# Patient Record
Sex: Male | Born: 1970 | Hispanic: No | Marital: Married | Smoking: Current every day smoker
Health system: Southern US, Community
[De-identification: ages and names within clinical notes are randomized; demographics above are authoritative.]

---

## 2006-03-28 HISTORY — PX: NASAL FRACTURE SURGERY: SHX718

## 2010-03-28 HISTORY — PX: OTHER SURGICAL HISTORY: SHX169

## 2010-05-10 ENCOUNTER — Emergency Department (HOSPITAL_BASED_OUTPATIENT_CLINIC_OR_DEPARTMENT_OTHER)
Admission: EM | Admit: 2010-05-10 | Discharge: 2010-05-10 | Disposition: A | Discharge status: Home / Self Care | Payer: Worker's Compensation | Attending: Emergency Medicine | Admitting: Emergency Medicine

## 2010-05-10 ENCOUNTER — Emergency Department (INDEPENDENT_AMBULATORY_CARE_PROVIDER_SITE_OTHER): Payer: Worker's Compensation

## 2010-05-10 DIAGNOSIS — S62339A Displaced fracture of neck of unspecified metacarpal bone, initial encounter for closed fracture: Secondary | ICD-10-CM | POA: Insufficient documentation

## 2010-05-10 DIAGNOSIS — Y9289 Other specified places as the place of occurrence of the external cause: Secondary | ICD-10-CM | POA: Insufficient documentation

## 2010-05-10 DIAGNOSIS — W19XXXA Unspecified fall, initial encounter: Secondary | ICD-10-CM | POA: Insufficient documentation

## 2010-05-10 DIAGNOSIS — Y9269 Other specified industrial and construction area as the place of occurrence of the external cause: Secondary | ICD-10-CM

## 2010-05-10 DIAGNOSIS — F17200 Nicotine dependence, unspecified, uncomplicated: Secondary | ICD-10-CM | POA: Insufficient documentation

## 2011-05-16 IMAGING — CR DG HAND COMPLETE 3+V*R*
3 series · 3 of 3 positions shown · non-contrast
Comparison: None.

CLINICAL DATA: Fall with fifth metacarpal pain.

RIGHT HAND - COMPLETE 3+ VIEW

[x hand pa right]
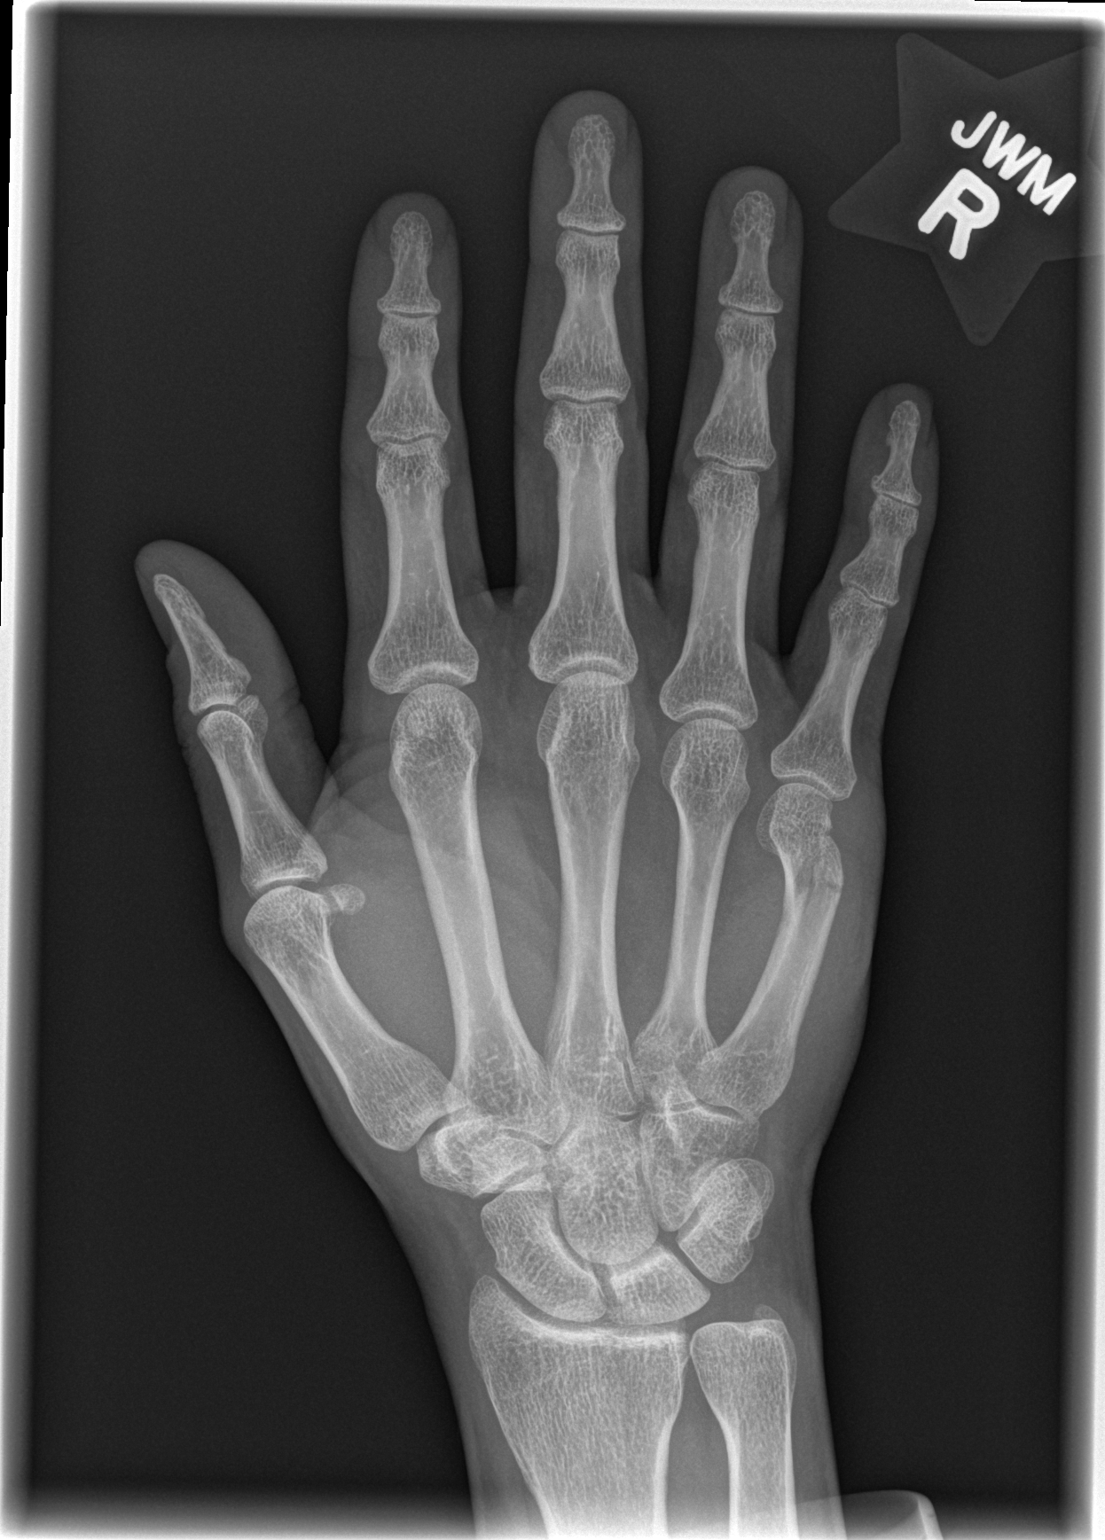

[x hand oblique right]
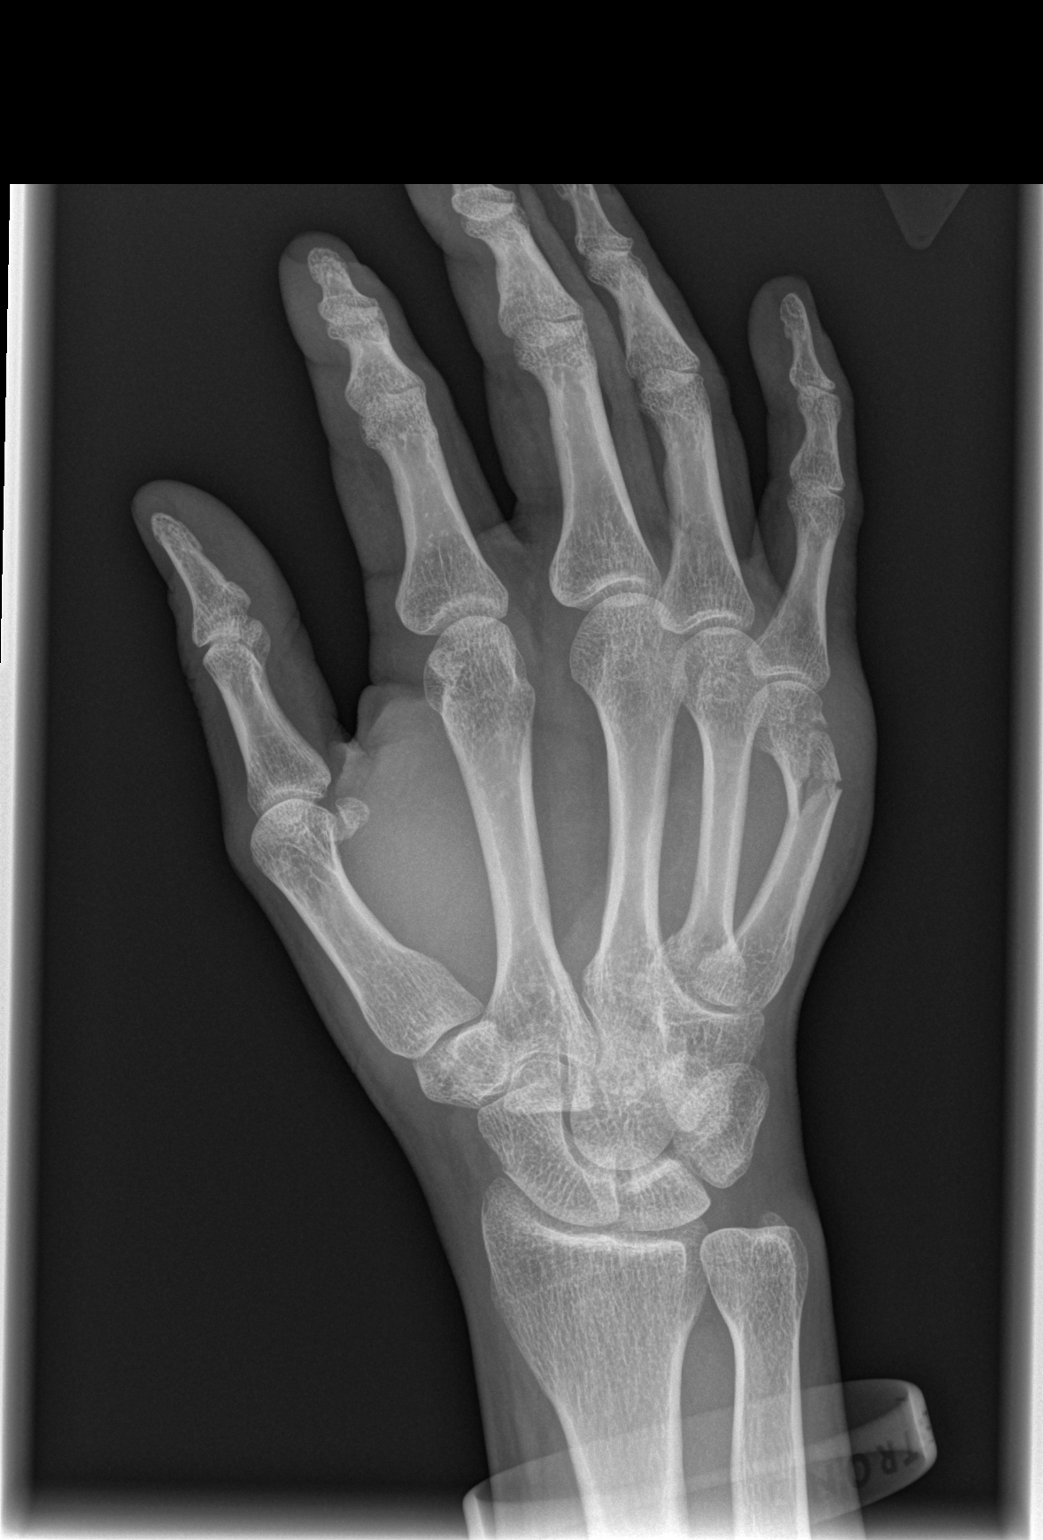

[x hand lat right]
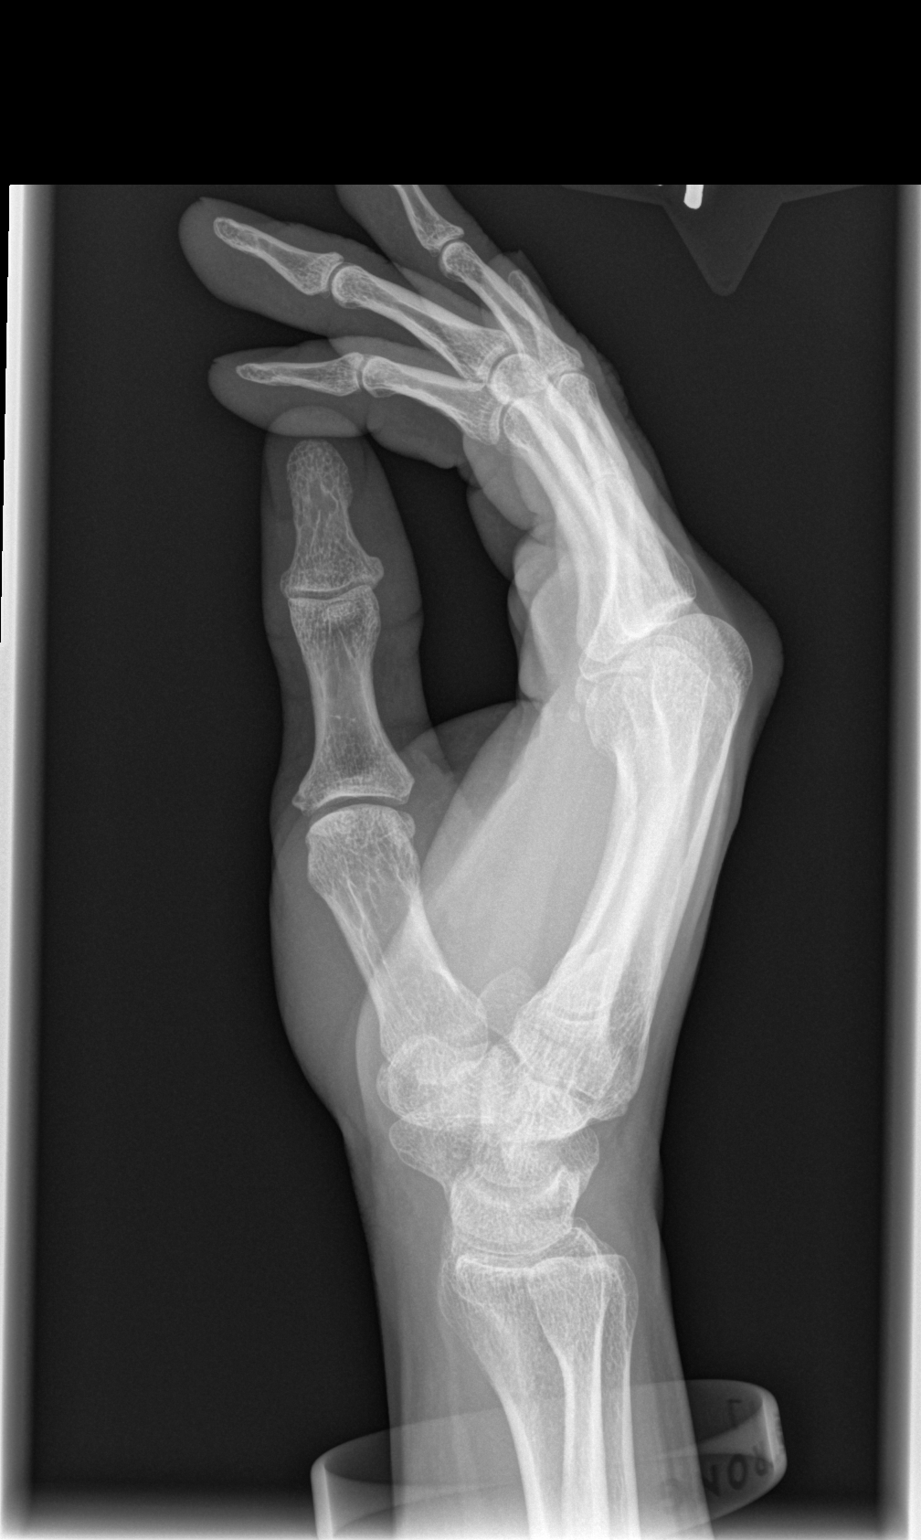

[3 of 3 positions shown; findings below may reference images not displayed]

FINDINGS: Comminuted fracture of the distal fifth metacarpal noted.
There is apex posterior angulation.  No definite evidence for
fracture extension to the articular surface of the distal fifth
metacarpal.  No other acute bony abnormality is evident.
IMPRESSION: Comminuted fracture of the distal fifth metacarpal with apex
posterior angulation.

## 2018-10-24 ENCOUNTER — Other Ambulatory Visit: Payer: Self-pay

## 2018-10-24 DIAGNOSIS — Z20822 Contact with and (suspected) exposure to covid-19: Secondary | ICD-10-CM

## 2018-10-25 LAB — NOVEL CORONAVIRUS, NAA: SARS-CoV-2, NAA: NOT DETECTED

## 2021-04-30 ENCOUNTER — Ambulatory Visit: Payer: Self-pay | Admitting: Internal Medicine

## 2021-05-03 ENCOUNTER — Other Ambulatory Visit: Payer: Self-pay

## 2021-05-03 ENCOUNTER — Encounter: Payer: Self-pay | Admitting: Internal Medicine

## 2021-05-03 ENCOUNTER — Ambulatory Visit: Payer: Self-pay | Admitting: Internal Medicine

## 2021-05-03 VITALS — BP 152/102 | HR 80 | Resp 12 | Ht 68.25 in | Wt 179.0 lb

## 2021-05-03 DIAGNOSIS — Z8342 Family history of familial hypercholesterolemia: Secondary | ICD-10-CM

## 2021-05-03 DIAGNOSIS — E7800 Pure hypercholesterolemia, unspecified: Secondary | ICD-10-CM | POA: Insufficient documentation

## 2021-05-03 DIAGNOSIS — R030 Elevated blood-pressure reading, without diagnosis of hypertension: Secondary | ICD-10-CM

## 2021-05-03 DIAGNOSIS — R1032 Left lower quadrant pain: Secondary | ICD-10-CM

## 2021-05-03 DIAGNOSIS — R7303 Prediabetes: Secondary | ICD-10-CM

## 2021-05-03 DIAGNOSIS — E010 Iodine-deficiency related diffuse (endemic) goiter: Secondary | ICD-10-CM

## 2021-05-03 DIAGNOSIS — Z833 Family history of diabetes mellitus: Secondary | ICD-10-CM

## 2021-05-03 DIAGNOSIS — Z720 Tobacco use: Secondary | ICD-10-CM

## 2021-05-03 HISTORY — DX: Pure hypercholesterolemia, unspecified: E78.00

## 2021-05-03 HISTORY — DX: Prediabetes: R73.03

## 2021-05-03 NOTE — Progress Notes (Signed)
Subjective:    Patient ID: GAVRIEL HOLZHAUER, male   DOB: Jul 21, 1970, 51 y.o.   MRN: [id number]   HPI  Here to establish   Back Pain/LLQ pain:  Has been a problem for about 3 years.  Does not happen often--maybe 3 times yearly and lasts up to 2 weeks, though sometimes only 2 days.  Bilateral low back with discomfort also in left low abdomen.  He thinks the pain wraps around to the front and does not shoot through to the front.  Describes the discomfort in his abdomen as aching or numb.  When he presses on the left low abdominal area when having the pain--increase in pain and describes like a needle in his abdomen.   Has tried ibuprofen and Tylenol, but does not really help.  An ice pack to his low abdomen helps.   Has never had an injury to his back.  He is not aware of anything repetitious prior to back pain starting.   Maybe when eats something spicy, he gets the abdominal pain and then the back pain--clarifies that the LLQ pain is what occurs first.  He also often gets a bitter taste in his mouth when he has the pain and can get nauseated and vomit when has the pain.   He has vomited many times with this pain. Has not noted melena or hematochezia with these abdominal pain episodes.  He occasionally sees bright red blood per rectum.  When he has these, his stools are flat and he has anal itching.  Denies constipation or diarrhea.   Has lost weight since 10/2020 as he cut out sodas and has eaten healthier.  Believes he has lost about 25 lbs.   Appears he has lost actually 11 lbs since 08/2020 Urgent Care visit.   2.  Elevated BP:  Has never been told has high BP.  This is the first time he has seen a doctor in 20 years other than his fracture and urgent care back in 08/2020.  Did not have elevated BP back in 6 /2022.    No outpatient medications have been marked as taking for the 05/03/21 encounter (Office Visit) with Julieanne Manson, MD.   No Known Allergies  Past Surgical History:   Procedure Laterality Date   NASAL FRACTURE SURGERY N/A 2008   ORIF Right 5th Metacarpal fracture Right 2012   Family History  Problem Relation Age of Onset   Hypertension Mother    Hyperlipidemia Father    Kidney disease Father        due to DM/Htn   Hypertension Father    Diabetes Father    Psoriasis Father    Cleft palate Daughter    Cancer Cousin 41       colon and pancreatic cancer (not clear what primary)   Cancer Nephew 55       colon or pancreatic cancer vs other GI cancer    Social History   Socioeconomic History   Marital status: Married    Spouse name: Johnny Bridge   Number of children: 3   Years of education: 16   Highest education level: Bachelor's degree (e.g., BA, AB, BS)  Occupational History   Not on file  Tobacco Use   Smoking status: Every Day    Packs/day: 0.25    Years: 30.00    Pack years: 7.50    Types: Cigarettes   Smokeless tobacco: Never  Vaping Use   Vaping Use: Never used  Substance and Sexual  Activity   Alcohol use: Yes    Comment: Rare   Drug use: Never   Sexual activity: Not on file  Other Topics Concern   Not on file  Social History Narrative   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   College grad in administration.   Has lived in [place] years   Lives with wife and two youngest children and his parents in [place].   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   Social Determinants of Corporate investment banker Strain: Low Risk    Difficulty of Paying Living Expenses: Not hard at all  Food Insecurity: No Food Insecurity   Worried About Programme researcher, broadcasting/film/video in the Last Year: Never true   Barista in the Last Year: Never true  Transportation Needs: No Transportation Needs   Lack of Transportation (Medical): No   Lack of Transportation (Non-Medical): No  Physical Activity: Not on file  Stress: Not on file  Social Connections: Not on file  Intimate Partner Violence: Not At Risk   Fear of Current or  Ex-Partner: No   Emotionally Abused: No   Physically Abused: No   Sexually Abused: No     PMH:  none     Review of Systems    Objective:   BP (!) 152/102 (BP Location: Left Arm, Patient Position: Sitting)    Pulse 80    Resp 12    Ht 5' 8.25" (1.734 m)    Wt 179 lb (81.2 kg)    BMI 27.02 kg/m   Physical Exam NAD HEENT:  PERRL, EOMI, TMs pearly gray, throat without injection Neck:  supple, No adenopathy, + thyomegaly Chest:  CTA CV:  RRR with normal S1 and S2, No S3, S4 or murmur.  No carotid bruits.  Carotid, radial and DP pulses normal and equal.   Abd:  S, NT, No HSM or mass, + BS LE:  No edema.   Assessment & Plan   Intermittent LLQ pain:  no findings today.  History supports likely constipation and internal hemorrhoids. Suspect internal hemorrhoids.  Discussed healthy diet with high fibers and good fluid intake.  CBC, CMP, PSA.  To return FIT in next 2 weeks.  2.  Tobacco abuse:  very infrequent use of cigarettes, but encouraged him to quit for prevention of lung disease, other chronic health issues.  He does not see this as a problem.  3.  Elevated BP:  BP check in 2 weeks.  4.  Family history of hypercholesterolemia and DM:  FLP and A1C today.  5.  Thyromegaly:  TSH  6.  HM:  no interest in any vaccines today:  discussed Tdap, Shingrix, COVID

## 2021-05-03 NOTE — Patient Instructions (Signed)

## 2021-05-04 LAB — COMPREHENSIVE METABOLIC PANEL
ALT: 28 IU/L (ref 0–44)
AST: 24 IU/L (ref 0–40)
Albumin/Globulin Ratio: 2 (ref 1.2–2.2)
Albumin: 4.8 g/dL (ref 3.8–4.9)
Alkaline Phosphatase: 116 IU/L (ref 44–121)
BUN/Creatinine Ratio: 11 (ref 9–20)
BUN: 12 mg/dL (ref 6–24)
Bilirubin Total: 1.1 mg/dL (ref 0.0–1.2)
CO2: 24 mmol/L (ref 20–29)
Calcium: 9.5 mg/dL (ref 8.7–10.2)
Chloride: 103 mmol/L (ref 96–106)
Creatinine, Ser: 1.11 mg/dL (ref 0.76–1.27)
Globulin, Total: 2.4 g/dL (ref 1.5–4.5)
Glucose: 100 mg/dL — ABNORMAL HIGH (ref 70–99)
Potassium: 4.4 mmol/L (ref 3.5–5.2)
Sodium: 141 mmol/L (ref 134–144)
Total Protein: 7.2 g/dL (ref 6.0–8.5)
eGFR: 80 mL/min/{1.73_m2} (ref >59)

## 2021-05-04 LAB — LIPID PANEL W/O CHOL/HDL RATIO
Cholesterol, Total: 214 mg/dL — ABNORMAL HIGH (ref 100–199)
HDL: 47 mg/dL (ref >39)
LDL Chol Calc (NIH): 146 mg/dL — ABNORMAL HIGH (ref 0–99)
Triglycerides: 117 mg/dL (ref 0–149)
VLDL Cholesterol Cal: 21 mg/dL (ref 5–40)

## 2021-05-04 LAB — CBC WITH DIFFERENTIAL/PLATELET
Basophils Absolute: 0.1 10*3/uL (ref 0.0–0.2)
Basos: 1 %
EOS (ABSOLUTE): 0 10*3/uL (ref 0.0–0.4)
Eos: 1 %
Hematocrit: 42.6 % (ref 37.5–51.0)
Hemoglobin: 14.4 g/dL (ref 13.0–17.7)
Immature Grans (Abs): 0 10*3/uL (ref 0.0–0.1)
Immature Granulocytes: 0 %
Lymphocytes Absolute: 1.5 10*3/uL (ref 0.7–3.1)
Lymphs: 26 %
MCH: 31 pg (ref 26.6–33.0)
MCHC: 33.8 g/dL (ref 31.5–35.7)
MCV: 92 fL (ref 79–97)
Monocytes Absolute: 0.3 10*3/uL (ref 0.1–0.9)
Monocytes: 5 %
Neutrophils Absolute: 4 10*3/uL (ref 1.4–7.0)
Neutrophils: 67 %
Platelets: 242 10*3/uL (ref 150–450)
RBC: 4.64 x10E6/uL (ref 4.14–5.80)
RDW: 12.1 % (ref 11.6–15.4)
WBC: 5.9 10*3/uL (ref 3.4–10.8)

## 2021-05-04 LAB — PSA: Prostate Specific Ag, Serum: 1.2 ng/mL (ref 0.0–4.0)

## 2021-05-04 LAB — HGB A1C W/O EAG: Hgb A1c MFr Bld: 5.9 % — ABNORMAL HIGH (ref 4.8–5.6)

## 2021-05-14 LAB — TSH: TSH: 1.51 u[IU]/mL (ref 0.450–4.500)

## 2021-05-17 ENCOUNTER — Encounter: Payer: Self-pay | Admitting: Internal Medicine

## 2021-05-18 ENCOUNTER — Encounter: Payer: Self-pay | Admitting: Internal Medicine

## 2021-05-18 ENCOUNTER — Other Ambulatory Visit: Payer: Self-pay

## 2021-05-18 ENCOUNTER — Other Ambulatory Visit: Payer: Self-pay | Admitting: Internal Medicine

## 2021-05-18 VITALS — BP 146/90 | HR 60 | Ht 68.25 in | Wt 182.0 lb

## 2021-05-18 DIAGNOSIS — K4090 Unilateral inguinal hernia, without obstruction or gangrene, not specified as recurrent: Secondary | ICD-10-CM

## 2021-05-18 DIAGNOSIS — M67441 Ganglion, right hand: Secondary | ICD-10-CM

## 2021-05-18 DIAGNOSIS — Z0130 Encounter for examination of blood pressure without abnormal findings: Secondary | ICD-10-CM

## 2021-05-18 DIAGNOSIS — I10 Essential (primary) hypertension: Secondary | ICD-10-CM

## 2021-05-18 MED ORDER — LISINOPRIL 10 MG PO TABS: 10.0000 mg | ORAL_TABLET | Freq: Every day | ORAL | 11 refills | Status: DC

## 2021-05-18 NOTE — Patient Instructions (Signed)
Bring FIT test to your bp check in 2 weeks

## 2021-05-18 NOTE — Progress Notes (Signed)
° ° °  Subjective:    Patient ID: Kurt Schultz, male   DOB: 1970-10-24, 51 y.o.   MRN: [id number]   HPI  Here for BP check, but converted to visit as other complaints.   Elevated BP:  continues to run elevated on recheck  2.  Right groin pain:  noted a few days after was here at beginning of month.  When stands, notes a swelling and pain.  Pain radiates around to posterior right pelvis.  No problems passing flatus or BM.  May have some mild nausea with the discomfort.    3.  Swelling over ulnar dorsal DIP joint of middle finger.  When does not do a lot, the swelling goes down, but if   No outpatient medications have been marked as taking for the 05/18/21 encounter (Lab) with Julieanne Manson, MD.   No Known Allergies   Review of Systems    Objective:   BP (!) 146/90 (BP Location: Left Arm, Patient Position: Sitting, Cuff Size: Normal)    Pulse 60    Ht 5' 8.25" (1.734 m)    Wt 182 lb (82.6 kg)    BMI 27.47 kg/m   Physical Exam  NAD Right groin:  Hernia palpated in right groin, not into scrotum.   Reduces completely when supine on exam table.  Unable to palpate the hernia opening. Does have some soft tissue fullness in left groin, but no definite hernia noted.    Right middle finger, ulnar aspect overlying dorsal DIP--cystic feeling lesion--appears to have 2 separate, but perhaps communicating cysts that extend to base of nail.  No erythema or warmth.  NT.  Full ROM of digit.   Assessment & Plan    Hypertension:  Encouraged lifestyle changes.  Start Lisinopril 10 mg daily.  BP check with BMP in 2 weeks.  Bring FIT in with visit as forgot today.  2.  Right inguinal hernia:  Applying for orange card and will then send to [facility].  Discussed what to look for to go to ED for incarcerated or strangulated hernia.  No lifting above 10 lbs letter for work.  3.   Cystic lesion overlying DIP of right middle finger.  Not clear if associated with joint or just overlying.   Referral to ortho.  Will need to apply for [facility].

## 2021-06-07 ENCOUNTER — Other Ambulatory Visit: Payer: Self-pay

## 2021-06-07 VITALS — BP 148/88 | HR 64

## 2021-06-07 DIAGNOSIS — Z0130 Encounter for examination of blood pressure without abnormal findings: Secondary | ICD-10-CM

## 2021-06-07 DIAGNOSIS — Z79899 Other long term (current) drug therapy: Secondary | ICD-10-CM

## 2021-06-07 DIAGNOSIS — Z1211 Encounter for screening for malignant neoplasm of colon: Secondary | ICD-10-CM

## 2021-06-07 LAB — POC FIT TEST STOOL: Fecal Occult Blood: NEGATIVE

## 2021-06-07 MED ORDER — LISINOPRIL 20 MG PO TABS: 10.0000 mg | ORAL_TABLET | Freq: Every day | ORAL | 11 refills | Status: DC

## 2021-06-07 NOTE — Progress Notes (Signed)
Patient reported that he has taken medication consistently without missing days. Patient did not take medication this morning because he usually takes it around 10 am. No questions or concerns regarding medication.  ?

## 2021-06-07 NOTE — Progress Notes (Signed)
Increase to 20 mg of Lisinopril daily--double up on 10 mg tabs once daily and then switch to 1 tab daily when fills 20 mg tabs.   ?BP and pulse check in 1 month ?

## 2021-06-07 NOTE — Addendum Note (Signed)
Addended by: Marcene Duos on: 06/07/2021 04:59 PM ? ? Modules accepted: Orders ? ?

## 2021-06-07 NOTE — Addendum Note (Signed)
Addended by: Mariah Milling on: 06/07/2021 03:34 PM ? ? Modules accepted: Orders ? ?

## 2021-06-08 LAB — BASIC METABOLIC PANEL
BUN/Creatinine Ratio: 13 (ref 9–20)
BUN: 14 mg/dL (ref 6–24)
CO2: 23 mmol/L (ref 20–29)
Calcium: 9.4 mg/dL (ref 8.7–10.2)
Chloride: 99 mmol/L (ref 96–106)
Creatinine, Ser: 1.11 mg/dL (ref 0.76–1.27)
Glucose: 119 mg/dL — ABNORMAL HIGH (ref 70–99)
Potassium: 4.2 mmol/L (ref 3.5–5.2)
Sodium: 144 mmol/L (ref 134–144)
eGFR: 80 mL/min/{1.73_m2} (ref >59)

## 2021-06-10 ENCOUNTER — Telehealth: Payer: Self-pay

## 2021-06-10 NOTE — Telephone Encounter (Signed)
Patient wanted to inform Dr Delrae Alfred that he did not get approved for surgery to help with hernia as previous discussed. Wants to know what the next course of action is. ?

## 2021-06-21 ENCOUNTER — Ambulatory Visit: Payer: Worker's Compensation | Admitting: Orthopedic Surgery

## 2021-07-12 ENCOUNTER — Ambulatory Visit: Payer: Self-pay

## 2021-07-12 VITALS — BP 150/96 | HR 68

## 2021-07-12 DIAGNOSIS — Z0130 Encounter for examination of blood pressure without abnormal findings: Secondary | ICD-10-CM

## 2021-07-12 MED ORDER — HYDROCHLOROTHIAZIDE 25 MG PO TABS: 25.0000 mg | ORAL_TABLET | Freq: Every day | ORAL | 0 refills | Status: AC

## 2021-07-12 NOTE — Progress Notes (Signed)
Patient reported that he is taking bp medication consistently, 20mg  tab daily. Patient took bp medication this morning.  ? ?After reporting bp to Dr Amil Amen, patient will start hydrochlorothiazide 25mg  daily with lisinopril and will recheck bp/BMP in 2 weeks  ?

## 2021-07-19 ENCOUNTER — Ambulatory Visit: Payer: Worker's Compensation | Admitting: Orthopedic Surgery

## 2021-08-02 ENCOUNTER — Other Ambulatory Visit: Payer: Self-pay

## 2021-08-13 ENCOUNTER — Encounter: Payer: Self-pay | Admitting: Internal Medicine

## 2021-08-16 ENCOUNTER — Ambulatory Visit: Payer: Worker's Compensation | Admitting: Orthopedic Surgery

## 2021-09-10 ENCOUNTER — Ambulatory Visit (INDEPENDENT_AMBULATORY_CARE_PROVIDER_SITE_OTHER): Payer: Self-pay | Admitting: Internal Medicine

## 2021-09-10 DIAGNOSIS — Z23 Encounter for immunization: Secondary | ICD-10-CM

## 2021-10-15 ENCOUNTER — Encounter: Payer: Self-pay | Admitting: Internal Medicine

## 2021-10-22 ENCOUNTER — Encounter: Payer: Self-pay | Admitting: Internal Medicine

## 2021-12-16 ENCOUNTER — Encounter: Payer: Self-pay | Admitting: Internal Medicine

## 2022-01-14 ENCOUNTER — Ambulatory Visit: Payer: Self-pay | Admitting: Internal Medicine

## 2022-01-28 ENCOUNTER — Ambulatory Visit: Payer: Self-pay | Admitting: Internal Medicine

## 2022-02-03 ENCOUNTER — Ambulatory Visit: Payer: Self-pay | Admitting: Internal Medicine

## 2022-04-01 ENCOUNTER — Ambulatory Visit (INDEPENDENT_AMBULATORY_CARE_PROVIDER_SITE_OTHER): Payer: Self-pay | Admitting: Internal Medicine

## 2022-04-01 DIAGNOSIS — Z23 Encounter for immunization: Secondary | ICD-10-CM

## 2022-04-05 ENCOUNTER — Encounter: Payer: Self-pay | Admitting: Internal Medicine

## 2022-06-28 ENCOUNTER — Encounter: Payer: Self-pay | Admitting: Internal Medicine

## 2022-08-08 ENCOUNTER — Encounter: Payer: Self-pay | Admitting: Internal Medicine

## 2022-09-22 ENCOUNTER — Telehealth: Payer: Self-pay

## 2022-09-22 NOTE — Telephone Encounter (Signed)
Called patient to offered on appointment for 09/26/2022 . Because patient was scheduled for a Friday appointment. But patient stated he could not make  it to that appt. Patient has been rescheduled 3 times.  Will call patient when there is a cancellation sooner than his scheduled appointment on 01/10/2023

## 2022-10-07 ENCOUNTER — Encounter: Payer: Self-pay | Admitting: Internal Medicine

## 2022-10-14 NOTE — Telephone Encounter (Signed)
Offer patient a sooner appointment. Patient did did not accept appointment.

## 2023-01-02 NOTE — Telephone Encounter (Signed)
Patient is currently out of town and would like to be on wait list for November.

## 2023-01-10 ENCOUNTER — Encounter: Payer: Self-pay | Admitting: Internal Medicine

## 2023-04-13 NOTE — Telephone Encounter (Signed)
Called patient on 04/11/2023. To offer an appointment, patient did not take. Patient decided to schedule his CPE appointment and just wait for that date.

## 2023-09-08 ENCOUNTER — Telehealth: Payer: Self-pay | Admitting: Internal Medicine

## 2023-09-08 NOTE — Telephone Encounter (Signed)
 Patient would like to be on wait list for an appointment for stomach problems. Patient states he is not able to explain to me what is happening with his stomach , but patient is concern that it may be related to his liver.  Patient will be working out of town and will be in town in the beginning of July.

## 2023-09-12 ENCOUNTER — Encounter: Payer: Self-pay | Admitting: Internal Medicine

## 2023-09-15 NOTE — Telephone Encounter (Signed)
 Called patient to offer appointment , patient did not take , patient out of town will be back after 09/30/23

## 2023-10-03 NOTE — Telephone Encounter (Signed)
 Called patient to offer appointment, patient did not take.

## 2023-10-10 NOTE — Telephone Encounter (Signed)
 Patient has been seen.

## 2023-10-12 ENCOUNTER — Encounter: Payer: Self-pay | Admitting: Internal Medicine

## 2023-10-12 ENCOUNTER — Ambulatory Visit: Payer: Self-pay | Admitting: Internal Medicine

## 2023-10-12 VITALS — BP 140/90 | HR 68 | Resp 18 | Ht 68.0 in | Wt 182.0 lb

## 2023-10-12 DIAGNOSIS — R7303 Prediabetes: Secondary | ICD-10-CM

## 2023-10-12 DIAGNOSIS — R109 Unspecified abdominal pain: Secondary | ICD-10-CM

## 2023-10-12 DIAGNOSIS — R1032 Left lower quadrant pain: Secondary | ICD-10-CM

## 2023-10-12 DIAGNOSIS — I10 Essential (primary) hypertension: Secondary | ICD-10-CM

## 2023-10-12 MED ORDER — LISINOPRIL 5 MG PO TABS: 5.0000 mg | ORAL_TABLET | Freq: Every day | ORAL | 11 refills | Status: AC

## 2023-10-12 MED ORDER — METAMUCIL 3 IN 1 DAILY FIBER 400 MG PO CAPS: ORAL_CAPSULE | ORAL | Status: AC

## 2023-10-12 MED ORDER — DICYCLOMINE HCL 20 MG PO TABS: ORAL_TABLET | ORAL | 4 refills | Status: AC

## 2023-10-12 NOTE — Progress Notes (Signed)
 Subjective:    Patient ID: Kurt Schultz, male   DOB: [birth year], 53 y.o.   MRN: [id number]   HPI  Has not been seen since 04/2021   Hypertension:  stopped taking bp meds, lisinopril  and hydrochlorothiazide  2 months ago.  Cannot say why.  Discussed importance of staying on meds to control BP and complications if does not.    2.  Abdominal discomfort:  Problem for more than 1 year.  Occurs at least weekly.  Generally lasts 2-3 days and then disappears.    Can last the entire week. Points to left mid to lower abdominal/flank area.  Feels like a pull  as a muscle is being pulled apart, and it is more intense, radiates around in both directions to his back.   He has associated nausea, but does not believe he has ever vomited.  + loss or appetite also with pain. Can happen at any time--has even awakened him from sleep.  Also states if he lies on left side, the pain will come on or worse if already apparent.   If passes flatus or stool, he does feel better for a while.   Can have constipation and diarrhea with this at times within same day.   No definite melena or hematochezia.   Feels dizzy and with headache at times with the pain. No weight loss.   Not clear if he has had a previous episode.  Just seems to be getting worse over the last year. May be worse with stress.   No family history of definitive digestive diagnoses.       Current Meds  Medication Sig   lansoprazole (PREVACID) 15 MG capsule Take 15 mg by mouth daily at 12 noon.   Multiple Vitamins-Minerals (IMMUNE SYSTEM BOOSTER PO) Take 1 tablet by mouth daily.   No Known Allergies   Review of Systems    Objective:   BP (!) 140/90 (BP Location: Right Arm, Patient Position: Sitting, Cuff Size: Normal)   Pulse 68   Resp 18   Ht 5' 8 (1.727 m)   Wt 182 lb (82.6 kg)   BMI 27.67 kg/m   Physical Exam HENT:     Head: Normocephalic and atraumatic.     Mouth/Throat:     Mouth: Mucous membranes are moist.      Pharynx: Oropharynx is clear.  Eyes:     Extraocular Movements: Extraocular movements intact.     Pupils: Pupils are equal, round, and reactive to light.  Neck:     Thyroid: No thyroid mass or thyromegaly.  Cardiovascular:     Rate and Rhythm: Normal rate and regular rhythm.     Heart sounds: S1 normal and S2 normal. No murmur heard.    No friction rub. No S3 or S4 sounds.     Comments: No carotid bruits.  Carotid, radial,  DP pulses normal and equal.   Pulmonary:     Breath sounds: Normal breath sounds and air entry.  Abdominal:     General: Bowel sounds are normal.     Palpations: Abdomen is soft. There is no hepatomegaly or splenomegaly.     Tenderness: There is abdominal tenderness in the left lower quadrant. There is no left CVA tenderness or rebound (No peritoneal signs).     Hernia: No hernia is present.  Musculoskeletal:     Cervical back: Normal range of motion and neck supple.     Right lower leg: No edema.     Left lower  leg: No edema.  Neurological:     Mental Status: He is alert.      Assessment & Plan   LLQ tenderness and abdominal pain:  No weight loss since last seen 2 years ago.  FIT to return in next 2 weeks.  CBC, CMP.  Start fiber laxative and dicyclomine  to see if improvement for possible IBS.  Refer to GI for diagnostic colonoscopy.  CT of abdomen and pelvis.  He will need to apply for [facility] orange card and let us  know when he receives to send.  Chose not to go through [facility] and pay up front at lower cost.  2.  Hypertension:  start Lisinopril  5 mg daily.  BMP in 2 weeks with BP and pulse check.  With me in 2 months.  3.  Prediabetes:  A1C   4.  HM:  FLP with bp check and BMP in 2 weeks.

## 2023-10-13 LAB — COMPREHENSIVE METABOLIC PANEL WITH GFR
ALT: 21 IU/L (ref 0–44)
AST: 24 IU/L (ref 0–40)
Albumin: 4.7 g/dL (ref 3.8–4.9)
Alkaline Phosphatase: 116 IU/L (ref 44–121)
BUN/Creatinine Ratio: 10 (ref 9–20)
BUN: 11 mg/dL (ref 6–24)
Bilirubin Total: 1.1 mg/dL (ref 0.0–1.2)
CO2: 21 mmol/L (ref 20–29)
Calcium: 9.6 mg/dL (ref 8.7–10.2)
Chloride: 106 mmol/L (ref 96–106)
Creatinine, Ser: 1.13 mg/dL (ref 0.76–1.27)
Globulin, Total: 2.6 g/dL (ref 1.5–4.5)
Glucose: 79 mg/dL (ref 70–99)
Potassium: 4.3 mmol/L (ref 3.5–5.2)
Sodium: 143 mmol/L (ref 134–144)
Total Protein: 7.3 g/dL (ref 6.0–8.5)
eGFR: 77 mL/min/1.73 (ref >59)

## 2023-10-13 LAB — CBC WITH DIFFERENTIAL/PLATELET
Basophils Absolute: 0.1 x10E3/uL (ref 0.0–0.2)
Basos: 1 %
EOS (ABSOLUTE): 0.1 x10E3/uL (ref 0.0–0.4)
Eos: 1 %
Hematocrit: 39.6 % (ref 37.5–51.0)
Hemoglobin: 13.3 g/dL (ref 13.0–17.7)
Immature Grans (Abs): 0 x10E3/uL (ref 0.0–0.1)
Immature Granulocytes: 0 %
Lymphocytes Absolute: 2 x10E3/uL (ref 0.7–3.1)
Lymphs: 27 %
MCH: 32 pg (ref 26.6–33.0)
MCHC: 33.6 g/dL (ref 31.5–35.7)
MCV: 95 fL (ref 79–97)
Monocytes Absolute: 0.4 x10E3/uL (ref 0.1–0.9)
Monocytes: 6 %
Neutrophils Absolute: 4.7 x10E3/uL (ref 1.4–7.0)
Neutrophils: 65 %
Platelets: 240 x10E3/uL (ref 150–450)
RBC: 4.16 x10E6/uL (ref 4.14–5.80)
RDW: 12.5 % (ref 11.6–15.4)
WBC: 7.2 x10E3/uL (ref 3.4–10.8)

## 2023-10-13 LAB — HGB A1C W/O EAG: Hgb A1c MFr Bld: 5.8 % — ABNORMAL HIGH (ref 4.8–5.6)

## 2023-10-27 ENCOUNTER — Other Ambulatory Visit: Payer: Self-pay

## 2023-11-03 ENCOUNTER — Other Ambulatory Visit: Payer: Self-pay

## 2023-11-03 DIAGNOSIS — E7800 Pure hypercholesterolemia, unspecified: Secondary | ICD-10-CM

## 2023-11-03 DIAGNOSIS — Z79899 Other long term (current) drug therapy: Secondary | ICD-10-CM

## 2023-11-03 DIAGNOSIS — Z1211 Encounter for screening for malignant neoplasm of colon: Secondary | ICD-10-CM

## 2023-11-03 LAB — POC FIT TEST STOOL: Fecal Occult Blood: NEGATIVE

## 2023-11-04 LAB — SPECIMEN STATUS

## 2023-11-08 LAB — BASIC METABOLIC PANEL WITH GFR
BUN/Creatinine Ratio: 9 (ref 9–20)
BUN: 10 mg/dL (ref 6–24)
CO2: 22 mmol/L (ref 20–29)
Calcium: 9.5 mg/dL (ref 8.7–10.2)
Chloride: 100 mmol/L (ref 96–106)
Creatinine, Ser: 1.07 mg/dL (ref 0.76–1.27)
Glucose: 103 mg/dL — ABNORMAL HIGH (ref 70–99)
Potassium: 4.4 mmol/L (ref 3.5–5.2)
Sodium: 138 mmol/L (ref 134–144)
eGFR: 82 mL/min/1.73 (ref >59)

## 2023-11-08 LAB — LIPID PANEL W/O CHOL/HDL RATIO
Cholesterol, Total: 189 mg/dL (ref 100–199)
HDL: 42 mg/dL (ref >39)
LDL Chol Calc (NIH): 116 mg/dL — ABNORMAL HIGH (ref 0–99)
Triglycerides: 177 mg/dL — ABNORMAL HIGH (ref 0–149)
VLDL Cholesterol Cal: 31 mg/dL (ref 5–40)

## 2023-11-08 LAB — SPECIMEN STATUS REPORT

## 2023-12-18 ENCOUNTER — Ambulatory Visit: Payer: Self-pay | Admitting: Internal Medicine

## 2023-12-18 DIAGNOSIS — R1032 Left lower quadrant pain: Secondary | ICD-10-CM | POA: Insufficient documentation

## 2023-12-26 ENCOUNTER — Ambulatory Visit: Payer: Self-pay | Admitting: Internal Medicine

## 2023-12-27 ENCOUNTER — Ambulatory Visit: Payer: Self-pay | Admitting: Internal Medicine

## 2024-01-01 ENCOUNTER — Ambulatory Visit: Payer: Self-pay | Admitting: Internal Medicine

## 2024-01-05 ENCOUNTER — Other Ambulatory Visit: Payer: Self-pay

## 2024-01-19 ENCOUNTER — Encounter: Payer: Self-pay | Admitting: Gastroenterology

## 2024-04-01 ENCOUNTER — Encounter: Payer: Self-pay | Admitting: Internal Medicine
